# Patient Record
Sex: Female | Born: 1990 | Race: White | Hispanic: No | Smoking: Never smoker
Health system: Southern US, Community
[De-identification: ages and names within clinical notes are randomized; demographics above are authoritative.]

## PROBLEM LIST (undated history)

## (undated) DIAGNOSIS — R55 Syncope and collapse: Secondary | ICD-10-CM

---

## 2020-03-10 ENCOUNTER — Encounter (HOSPITAL_COMMUNITY): Payer: Self-pay

## 2020-03-10 ENCOUNTER — Emergency Department (HOSPITAL_COMMUNITY): Payer: Self-pay

## 2020-03-10 ENCOUNTER — Other Ambulatory Visit: Payer: Self-pay

## 2020-03-10 ENCOUNTER — Emergency Department (HOSPITAL_COMMUNITY)
Admission: EM | Admit: 2020-03-10 | Discharge: 2020-03-10 | Disposition: A | Discharge status: Home / Self Care | Payer: Self-pay | Attending: Emergency Medicine | Admitting: Emergency Medicine

## 2020-03-10 DIAGNOSIS — M25511 Pain in right shoulder: Secondary | ICD-10-CM | POA: Insufficient documentation

## 2020-03-10 DIAGNOSIS — R1011 Right upper quadrant pain: Secondary | ICD-10-CM | POA: Insufficient documentation

## 2020-03-10 DIAGNOSIS — R110 Nausea: Secondary | ICD-10-CM | POA: Insufficient documentation

## 2020-03-10 DIAGNOSIS — R109 Unspecified abdominal pain: Secondary | ICD-10-CM

## 2020-03-10 HISTORY — DX: Syncope and collapse: R55

## 2020-03-10 LAB — COMPREHENSIVE METABOLIC PANEL
ALT: 13 U/L (ref 0–44)
AST: 16 U/L (ref 15–41)
Albumin: 4.2 g/dL (ref 3.5–5.0)
Alkaline Phosphatase: 51 U/L (ref 38–126)
Anion gap: 9 (ref 5–15)
BUN: 8 mg/dL (ref 6–20)
CO2: 26 mmol/L (ref 22–32)
Calcium: 9.1 mg/dL (ref 8.9–10.3)
Chloride: 102 mmol/L (ref 98–111)
Creatinine, Ser: 0.62 mg/dL (ref 0.44–1.00)
GFR calc Af Amer: >60 mL/min (ref >60)
GFR calc non Af Amer: >60 mL/min (ref >60)
Glucose, Bld: 83 mg/dL (ref 70–99)
Potassium: 3.4 mmol/L — ABNORMAL LOW (ref 3.5–5.1)
Sodium: 137 mmol/L (ref 135–145)
Total Bilirubin: 0.7 mg/dL (ref 0.3–1.2)
Total Protein: 7.1 g/dL (ref 6.5–8.1)

## 2020-03-10 LAB — CBC WITH DIFFERENTIAL/PLATELET
Abs Immature Granulocytes: 0.03 10*3/uL (ref 0.00–0.07)
Basophils Absolute: 0 10*3/uL (ref 0.0–0.1)
Basophils Relative: 0 %
Eosinophils Absolute: 0.1 10*3/uL (ref 0.0–0.5)
Eosinophils Relative: 1 %
HCT: 45.5 % (ref 36.0–46.0)
Hemoglobin: 14.9 g/dL (ref 12.0–15.0)
Immature Granulocytes: 0 %
Lymphocytes Relative: 25 %
Lymphs Abs: 2.1 10*3/uL (ref 0.7–4.0)
MCH: 31 pg (ref 26.0–34.0)
MCHC: 32.7 g/dL (ref 30.0–36.0)
MCV: 94.8 fL (ref 80.0–100.0)
Monocytes Absolute: 0.4 10*3/uL (ref 0.1–1.0)
Monocytes Relative: 5 %
Neutro Abs: 5.7 10*3/uL (ref 1.7–7.7)
Neutrophils Relative %: 69 %
Platelets: 270 10*3/uL (ref 150–400)
RBC: 4.8 MIL/uL (ref 3.87–5.11)
RDW: 11.7 % (ref 11.5–15.5)
WBC: 8.3 10*3/uL (ref 4.0–10.5)
nRBC: 0 % (ref 0.0–0.2)

## 2020-03-10 LAB — URINALYSIS, ROUTINE W REFLEX MICROSCOPIC
Bilirubin Urine: NEGATIVE
Glucose, UA: NEGATIVE mg/dL
Hgb urine dipstick: NEGATIVE
Ketones, ur: 80 mg/dL — AB
Leukocytes,Ua: NEGATIVE
Nitrite: NEGATIVE
Protein, ur: NEGATIVE mg/dL
Specific Gravity, Urine: 1.014 (ref 1.005–1.030)
pH: 6 (ref 5.0–8.0)

## 2020-03-10 LAB — LIPASE, BLOOD: Lipase: 19 U/L (ref 11–51)

## 2020-03-10 LAB — I-STAT BETA HCG BLOOD, ED (MC, WL, AP ONLY): I-stat hCG, quantitative: <5 m[IU]/mL (ref <5)

## 2020-03-10 MED ORDER — SODIUM CHLORIDE 0.9 % IV BOLUS
500.0000 mL | Freq: Once | INTRAVENOUS | Status: AC
  Administered 2020-03-10: 500 mL via INTRAVENOUS

## 2020-03-10 MED ORDER — GADOBUTROL 1 MMOL/ML IV SOLN
6.5000 mL | Freq: Once | INTRAVENOUS | Status: AC | PRN
  Administered 2020-03-10: 6.5 mL via INTRAVENOUS

## 2020-03-10 MED ORDER — ALUM & MAG HYDROXIDE-SIMETH 200-200-20 MG/5ML PO SUSP
30.0000 mL | Freq: Once | ORAL | Status: AC
  Administered 2020-03-10: 18:00:00 30 mL via ORAL
  Filled 2020-03-10: qty 30

## 2020-03-10 MED ORDER — SODIUM CHLORIDE 0.9% FLUSH: 3.0000 mL | Freq: Once | INTRAVENOUS | Status: DC

## 2020-03-10 MED ORDER — LIDOCAINE VISCOUS HCL 2 % MT SOLN
15.0000 mL | Freq: Once | OROMUCOSAL | Status: AC
  Administered 2020-03-10: 15 mL via ORAL
  Filled 2020-03-10: qty 15

## 2020-03-10 MED ORDER — MORPHINE SULFATE (PF) 4 MG/ML IV SOLN
4.0000 mg | Freq: Once | INTRAVENOUS | Status: DC
  Filled 2020-03-10: qty 1

## 2020-03-10 MED ORDER — PANTOPRAZOLE SODIUM 20 MG PO TBEC: 20.0000 mg | DELAYED_RELEASE_TABLET | Freq: Every day | ORAL | 0 refills | Status: AC

## 2020-03-10 MED ORDER — MORPHINE SULFATE (PF) 2 MG/ML IV SOLN: 2.0000 mg | Freq: Once | INTRAVENOUS | Status: DC

## 2020-03-10 NOTE — ED Provider Notes (Signed)
[facility]-EMERGENCY DEPT Provider Note   CSN: [id number] Arrival date & time: 03/10/20  1412     History Chief Complaint  Patient presents with  . Abdominal Pain    Jenny Weiss is a 29 y.o. female otherwise healthy no daily medication use presents today for right upper quadrant abdominal pain beginning 2 days ago.  Pain is intermittent waxing and waning aching squeezing sensation that is worse approximately 1 hour after eating gradually resolves without intervention.  Occasionally pain will radiate to right shoulder when it is most severe.  Pain currently minimal.  Associated with nausea.  Denies fever/chills, headache, chest pain/shortness of breath, cough/hemoptysis, pleurisy, vomiting, diarrhea, dysuria/hematuria, vaginal bleeding/discharge or any additional concerns.  HPI     Past Medical History:  Diagnosis Date  . Syncope     There are no problems to display for this patient.   History reviewed. No pertinent surgical history.   OB History   No obstetric history on file.     History reviewed. No pertinent family history.  Social History   Tobacco Use  . Smoking status: Never Smoker  . Smokeless tobacco: Never Used  Substance Use Topics  . Alcohol use: Yes    Comment: one beer a day   . Drug use: Never    Home Medications Prior to Admission medications   Medication Sig Start Date End Date Taking? Authorizing Provider  pantoprazole (PROTONIX) 20 MG tablet Take 1 tablet (20 mg total) by mouth daily. 03/10/20 04/09/20  Bill Salinas, PA-C    Allergies    Patient has no known allergies.  Review of Systems   Review of Systems Ten systems are reviewed and are negative for acute change except as noted in the HPI  Physical Exam Updated Vital Signs BP 117/68   Pulse 71   Temp 98.2 F (36.8 C) (Oral)   Resp 18   Ht 5\' 8"  (1.727 m)   Wt 52.2 kg   LMP 03/03/2020 (Approximate)   SpO2 93%   BMI 17.49 kg/m   Physical  Exam Constitutional:      General: She is not in acute distress.    Appearance: Normal appearance. She is well-developed. She is not ill-appearing or diaphoretic.  HENT:     Head: Normocephalic and atraumatic.     Right Ear: External ear normal.     Left Ear: External ear normal.     Nose: Nose normal.  Eyes:     General: Vision grossly intact. Gaze aligned appropriately.     Pupils: Pupils are equal, round, and reactive to light.  Neck:     Trachea: Trachea and phonation normal. No tracheal deviation.  Cardiovascular:     Rate and Rhythm: Normal rate and regular rhythm.  Pulmonary:     Effort: Pulmonary effort is normal. No respiratory distress.     Breath sounds: Normal breath sounds.  Abdominal:     General: There is no distension.     Palpations: Abdomen is soft.     Tenderness: There is abdominal tenderness in the right upper quadrant. There is no right CVA tenderness, left CVA tenderness, guarding or rebound. Positive signs include Murphy's sign. Negative signs include McBurney's sign.  Genitourinary:    Comments: Deferred by patient Musculoskeletal:        General: Normal range of motion.     Cervical back: Normal range of motion.  Skin:    General: Skin is warm and dry.  Neurological:  Mental Status: She is alert.     GCS: GCS eye subscore is 4. GCS verbal subscore is 5. GCS motor subscore is 6.     Comments: Speech is clear and goal oriented, follows commands Major Cranial nerves without deficit, no facial droop Moves extremities without ataxia, coordination intact  Psychiatric:        Behavior: Behavior normal.     ED Results / Procedures / Treatments   Labs (all labs ordered are listed, but only abnormal results are displayed) Labs Reviewed  COMPREHENSIVE METABOLIC PANEL - Abnormal; Notable for the following components:      Result Value   Potassium 3.4 (*)    All other components within normal limits  URINALYSIS, ROUTINE W REFLEX MICROSCOPIC - Abnormal;  Notable for the following components:   Ketones, ur 80 (*)    All other components within normal limits  LIPASE, BLOOD  CBC WITH DIFFERENTIAL/PLATELET  I-STAT BETA HCG BLOOD, ED ([facility], [facility], [facility] ONLY)    EKG None  Radiology MR 3D Recon At Scanner  Result Date: 03/10/2020 CLINICAL DATA:  Right upper quadrant abdominal pain. Nondiagnostic ultrasound with nonvisualization of the gallbladder. No history of abdominal surgery. EXAM: MRI ABDOMEN WITH CONTRAST (WITH MRCP) TECHNIQUE: Multiplanar multisequence MR imaging of the abdomen was performed following the administration of intravenous contrast. Heavily T2-weighted images of the biliary and pancreatic ducts were obtained, and three-dimensional MRCP images were rendered by post processing. CONTRAST:  6.27mL GADAVIST GADOBUTROL 1 MMOL/ML IV SOLN COMPARISON:  03/10/2020 abdominal sonogram. FINDINGS: Lower chest: No acute abnormality at the lung bases. Hepatobiliary: Normal liver size and configuration. No hepatic steatosis. No liver mass. Gallbladder not visualized. No biliary ductal dilatation. Common bile duct diameter 4 mm. No evidence of choledocholithiasis. No biliary strictures, masses or beading. Pancreas: No pancreatic mass or duct dilation.  No pancreas divisum. Spleen: Normal size. No mass. Adrenals/Urinary Tract: Normal adrenals. No hydronephrosis. Normal kidneys with no renal mass. Stomach/Bowel: Normal non-distended stomach. Visualized small and large bowel is normal caliber, with no bowel wall thickening. Vascular/Lymphatic: Normal caliber abdominal aorta. Patent portal, splenic, hepatic and renal veins. No pathologically enlarged lymph nodes in the abdomen. Other: No abdominal ascites or focal fluid collection. Musculoskeletal: No aggressive appearing focal osseous lesions. IMPRESSION: 1. No acute abnormality. 2. Gallbladder not visualized. Patient has no history of abdominal surgery by report. 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 3. Normal bile ducts. No choledocholithiasis. Electronically Signed   By: Ilona Sorrel M.D.   On: 03/10/2020 21:09   MR ABDOMEN WITH MRCP W CONTRAST  Result Date: 03/10/2020 CLINICAL DATA:  Right upper quadrant abdominal pain. Nondiagnostic ultrasound with nonvisualization of the gallbladder. No history of abdominal surgery. EXAM: MRI ABDOMEN WITH CONTRAST (WITH MRCP) TECHNIQUE: Multiplanar multisequence MR imaging of the abdomen was performed following the administration of intravenous contrast. Heavily T2-weighted images of the biliary and pancreatic ducts were obtained, and three-dimensional MRCP images were rendered by post processing. CONTRAST:  6.3mL GADAVIST GADOBUTROL 1 MMOL/ML IV SOLN COMPARISON:  03/10/2020 abdominal sonogram. FINDINGS: Lower chest: No acute abnormality at the lung bases. Hepatobiliary: Normal liver size and configuration. No hepatic steatosis. No liver mass. Gallbladder not visualized. No biliary ductal dilatation. Common bile duct diameter 4 mm. No evidence of choledocholithiasis. No biliary strictures, masses or beading. Pancreas: No pancreatic mass or duct dilation.  No pancreas divisum. Spleen: Normal size. No mass. Adrenals/Urinary Tract: Normal adrenals. No hydronephrosis. Normal kidneys with no renal mass. Stomach/Bowel: Normal non-distended stomach.  Visualized small and large bowel is normal caliber, with no bowel wall thickening. Vascular/Lymphatic: Normal caliber abdominal aorta. Patent portal, splenic, hepatic and renal veins. No pathologically enlarged lymph nodes in the abdomen. Other: No abdominal ascites or focal fluid collection. Musculoskeletal: No aggressive appearing focal osseous lesions. IMPRESSION: 1. No acute abnormality. 2. Gallbladder not visualized. Patient has no history of abdominal surgery by report. 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 3. Normal bile ducts. No choledocholithiasis. Electronically Signed   By:  Delbert Phenix M.D.   On: 03/10/2020 21:09   US Abdomen Limited RUQ  Result Date: 03/10/2020 CLINICAL DATA:  Right upper quadrant abdominal pain. Patient reports no history of abdominal surgery. EXAM: ULTRASOUND ABDOMEN LIMITED RIGHT UPPER QUADRANT COMPARISON:  None. FINDINGS: Gallbladder: Gallbladder could not be identified. Common bile duct: Diameter: 3 mm Liver: No focal lesion identified. Within normal limits in parenchymal echogenicity. Portal vein is patent on color Doppler imaging with normal direction of blood flow towards the liver. Other: None. IMPRESSION: 1. Nonvisualization of the gallbladder by ultrasound. Differential includes congenital absence of the gallbladder or occult completely contracted gallbladder. Further evaluation with MRI abdomen with MRCP without and with IV contrast or CT abdomen with IV and oral contrast could be considered as clinically warranted. 2. No biliary ductal dilatation.  CBD diameter 3 mm. 3. Normal liver. Electronically Signed   By: Delbert Phenix M.D.   On: 03/10/2020 18:05    Procedures Procedures (including critical care time)  Medications Ordered in ED Medications  sodium chloride flush (NS) 0.9 % injection 3 mL (has no administration in time range)  morphine 2 MG/ML injection 2 mg (2 mg Intravenous Not Given 03/10/20 1856)  alum & mag hydroxide-simeth (MAALOX/MYLANTA) 200-200-20 MG/5ML suspension 30 mL (30 mLs Oral Given 03/10/20 1745)    And  lidocaine (XYLOCAINE) 2 % viscous mouth solution 15 mL (15 mLs Oral Given 03/10/20 1745)  sodium chloride 0.9 % bolus 500 mL (0 mLs Intravenous Stopped 03/10/20 2227)  gadobutrol (GADAVIST) 1 MMOL/ML injection 6.5 mL (6.5 mLs Intravenous Contrast Given 03/10/20 2047)    ED Course  I have reviewed the triage vital signs and the nursing notes.  Pertinent labs & imaging results that were available during my care of the patient were reviewed by me and considered in my medical decision making (see chart for  details).    MDM Rules/Calculators/A&P                     29 year old otherwise healthy female presents today for right upper quadrant pain x2 days worse after eating.  On exam she is well-appearing no acute distress, vital signs stable.  She has mild right upper quadrant tenderness and a positive Murphy sign.  No guarding rebound or lower abdominal tenderness.  Blood work ordered in triage does not have been drawn, clinically concern for possible symptomatic cholelithiasis versus cholecystitis, right upper quadrant ultrasound has been ordered.  She has no chest pain or shortness of breath, low risk by Wells criteria and PERC negative, low suspicion for pulmonary embolism, pneumonia, ACS or other intrathoracic etiology of patient's pain today. - RUQ ultrasound:  IMPRESSION:  1. Nonvisualization of the gallbladder by ultrasound. Differential  includes congenital absence of the gallbladder or occult completely  contracted gallbladder. Further evaluation with MRI abdomen with  MRCP without and with IV contrast or CT abdomen with IV and oral  contrast could be considered as clinically warranted.  2. No biliary ductal dilatation.  CBD diameter 3 mm.  3. Normal liver.   Patient reevaluated GI cocktail did not improve symptoms.  She states understanding of ultrasound findings as above, I discussed CT versus MRI imaging today, patient does not want a CT scan due to radiation.  She is requesting MRI of the abdomen for further evaluation feel this is reasonable at this time.  MRI with MRCP ordered. - Blood work resulted, CBC within normal limits no leukocytosis to suggest infection, no evidence of anemia.  Lipase within normal limits evidence of pancreatitis.  Pregnancy test negative.  CMP shows potassium 3.4 otherwise within normal limits, no emergent electrolyte derangement, evidence of kidney injury or elevation of LFTs.  Urinalysis pending. EKG shows sinus rhythm at rate of 75; no acute changes  reviewed with Dr. Effie Shy. - Patient reevaluated resting comfortably no acute distress awaiting MRI.  Vital signs stable - MRI/MRCP: IMPRESSION:  1. No acute abnormality.  2. Gallbladder not visualized. Patient has no history of abdominal  surgery by report. 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  3. Normal bile ducts. No choledocholithiasis   Urinalysis shows ketones, suspect secondary to dehydration.  She was given fluid bolus today, should improve with return increasing water intake at home; she has no evidence of DKA today and is not a diabetic. - Patient reevaluated resting comfortably no acute distress, she states understanding of findings as above and has no questions.  We will treat patient today with PPI and have her follow-up with her primary care provider.  On reassessment the abdomen is soft nontender and without peritoneal signs.  Her pain is in the right upper quadrant, possible that her gallbladder atresia some colicky pain today, there is no evidence of obstruction or other emergent intra-abdominal pathology requiring further work-up in the ER at this time.  Additionally she has no lower abdominal pain to suggest PID, torsion, TOA or other intrapelvic etiology at this time, patient deferred pelvic examination which is reasonable at this time.  She is agreeable to discharge and outpatient PCP follow-up.  At this time there does not appear to be any evidence of an acute emergency medical condition and the patient appears stable for discharge with appropriate outpatient follow up. Diagnosis was discussed with patient who verbalizes understanding of care plan and is agreeable to discharge. I have discussed return precautions with patient who verbalizes understanding of return precautions. Patient encouraged to follow-up with their PCP. All questions answered.  Patient's case discussed with Dr. Effie Shy who agrees with plan to discharge with follow-up.   Note:  Portions of this report may have been transcribed using voice recognition software. Every effort was made to ensure accuracy; however, inadvertent computerized transcription errors may still be present. Final Clinical Impression(s) / ED Diagnoses Final diagnoses:  RUQ abdominal pain    Rx / DC Orders ED Discharge Orders         Ordered    pantoprazole (PROTONIX) 20 MG tablet  Daily     03/10/20 2320           Elizabeth Palau 03/10/20 2324    Mancel Bale, MD 03/11/20 1038

## 2020-03-10 NOTE — ED Notes (Signed)
Patient transported to MRI 

## 2020-03-10 NOTE — ED Notes (Signed)
US bedside

## 2020-03-10 NOTE — ED Triage Notes (Signed)
Pt presents with c/o right upper quadrant abdominal pain. Pt reports the pain has been present for 2 days. Pt reports that when the pain started, she also was feeling significant right shoulder pain. Pt denies V/D but does reports some nausea. Pt reports the pain is worse a few hours after eating.

## 2020-03-10 NOTE — Discharge Instructions (Addendum)
You have been diagnosed today with right upper quadrant pain.  At this time there does not appear to be the presence of an emergent medical condition, however there is always the potential for conditions to change. Please read and follow the below instructions.  Please return to the Emergency Department immediately for any new or worsening symptoms. Please be sure to follow up with your Primary Care Provider within one week regarding your visit today; please call their office to schedule an appointment even if you are feeling better for a follow-up visit. Please take the medication Protonix as prescribed to help with your symptoms.  Please drink plenty of water and get plenty of rest. As we discussed it appears that you do not have a gallbladder and may have been born this way.  Please discuss this with your primary care provider at your follow-up visit this week.  Get help right away if: Your pain does not go away as soon as your doctor says it should. You cannot stop vomiting. Your pain is only in areas of your belly, such as the right side or the left lower part of the belly. You have bloody or black poop, or poop that looks like tar. You have very bad pain, cramping, or bloating in your belly. You have signs of not having enough fluid or water in your body (dehydration), such as: Dark pee, very little pee, or no pee. Cracked lips. Dry mouth. Sunken eyes. Sleepiness. Weakness. You have trouble breathing or chest pain. You have any new/concerning or worsening of symptoms  Please read the additional information packets attached to your discharge summary.  Do not take your medicine if  develop an itchy rash, swelling in your mouth or lips, or difficulty breathing; call 911 and seek immediate emergency medical attention if this occurs.  Note: Portions of this text may have been transcribed using voice recognition software. Every effort was made to ensure accuracy; however, inadvertent  computerized transcription errors may still be present.

## 2021-02-14 IMAGING — MR MR ABDOMEN W/ CM MRCP
15 of 16 series · 45 of 48 positions shown · IV contrast (gadavist)
Comparison: 03/10/2020 abdominal sonogram.

CLINICAL DATA: Right upper quadrant abdominal pain. Nondiagnostic
ultrasound with nonvisualization of the gallbladder. No history of
abdominal surgery.

EXAM:
MRI ABDOMEN WITH CONTRAST (WITH MRCP)
TECHNIQUE: Multiplanar multisequence MR imaging of the abdomen was performed
following the administration of intravenous contrast. Heavily
T2-weighted images of the biliary and pancreatic ducts were
obtained, and three-dimensional MRCP images were rendered by post
processing.
CONTRAST:  6.5mL GADAVIST GADOBUTROL 1 MMOL/ML IV SOLN

[Series 3: T2 fat-sat · axial · 6.0mm · 1.25mm/px · z∈[-181,+71]mm · 2 of 36 slices shown]
[im 1/36]
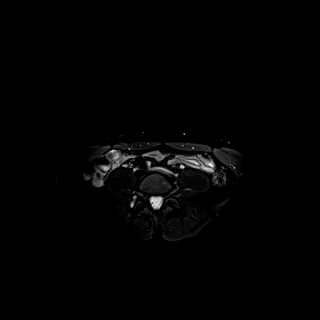
[im 36/36]
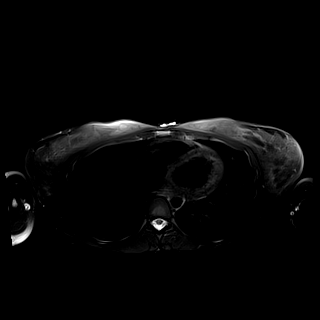

[Series 5: bSSFP · coronal · 6.0mm · 0.74mm/px · 2 of 35 slices shown]
[im 1/35]
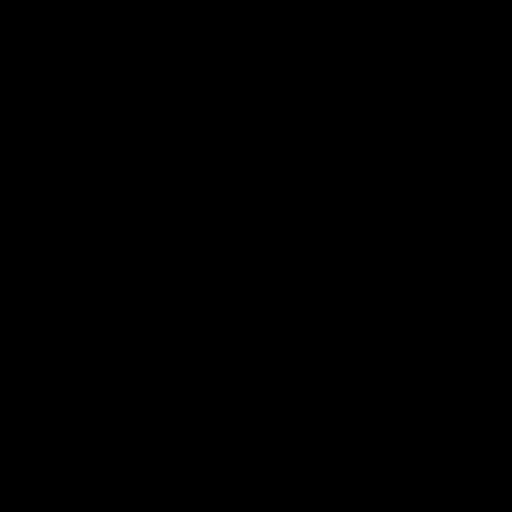
[im 35/35]
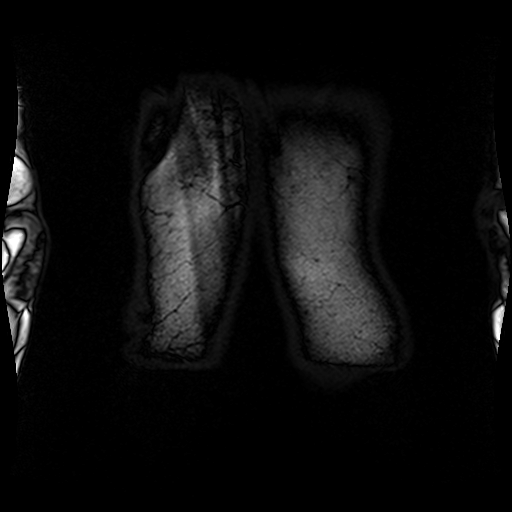

[Series 6: DWI · axial · 6.0mm · 1.49mm/px · z∈[-158,+94]mm · 4 of 72 slices shown (1 of 2)]
[im 1/72]
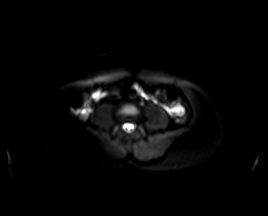
[im 24/72]
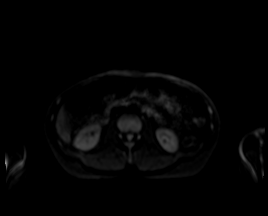
[im 48/72]
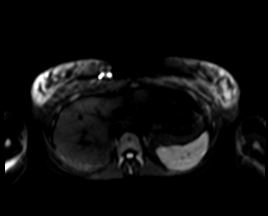
[im 72/72]
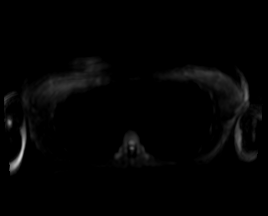

[Series 7: DWI · axial · 6.0mm · 1.49mm/px · z∈[-158,+94]mm · 2 of 36 slices shown (2 of 2)]
[im 1/36]
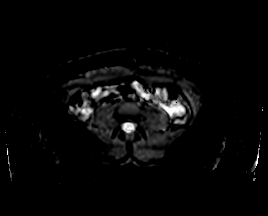
[im 36/36]
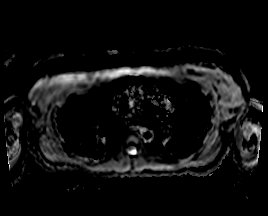

[Series 9: T1 · axial · 3.0mm · 1.25mm/px · z∈[-133,+80]mm · 4 of 72 slices shown (1 of 2)]
[im 1/72]
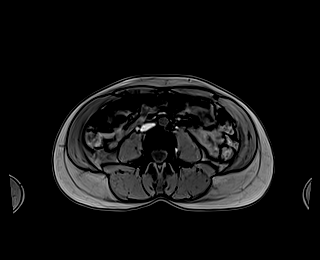
[im 24/72]
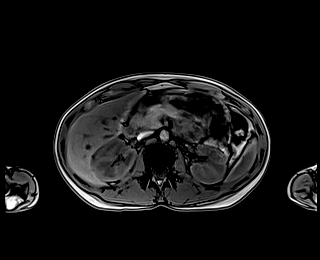
[im 48/72]
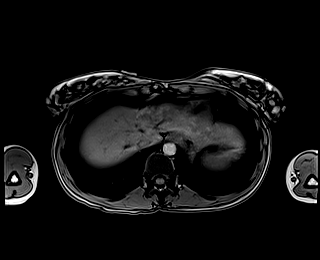
[im 72/72]
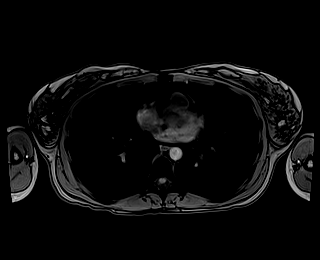

[Series 10: T1 · axial · 3.0mm · 1.25mm/px · z∈[-133,+80]mm · 4 of 72 slices shown (2 of 2)]
[im 1/72]
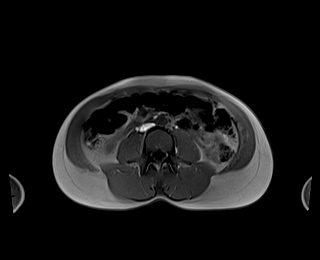
[im 24/72]
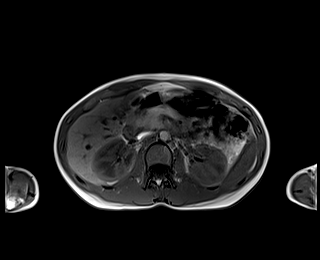
[im 48/72]
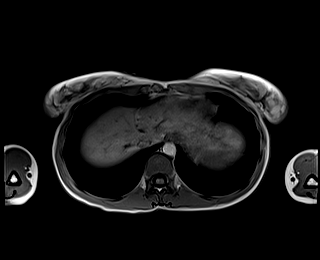
[im 72/72]
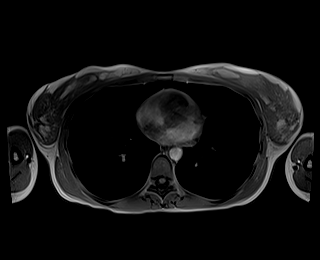

[Series 11: cor obl thk · sagittal · 50.0mm · 0.78mm/px · 1 of 9 slices shown]
[im 1/9]
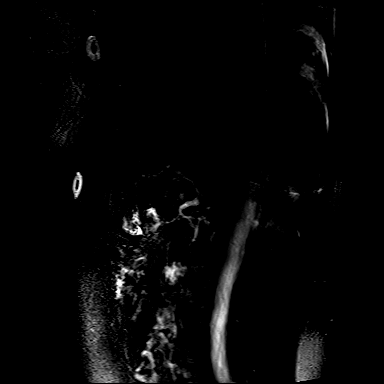

[Series 12: cor_3d_spc_trig-resp · 1 of 11 slices shown]
[im 1/11]
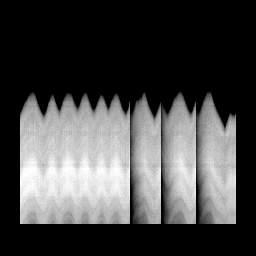

[Series 15: T2 · axial · 6.0mm · 1.56mm/px · z∈[-130,+78]mm · 2 of 30 slices shown]
[im 1/30]
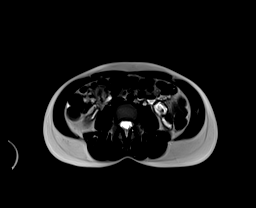
[im 30/30]
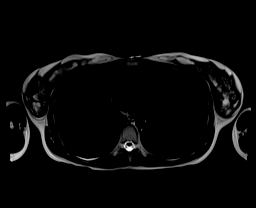

[Series 17: T1 dynamic · axial · 3.0mm · 1.19mm/px · z∈[-145,+92]mm · 4 of 80 slices shown (1 of 6)]
[im 1/80]
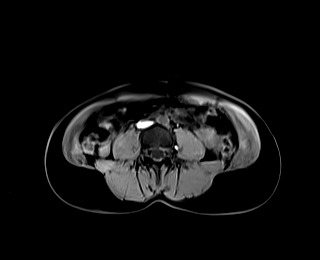
[im 27/80]
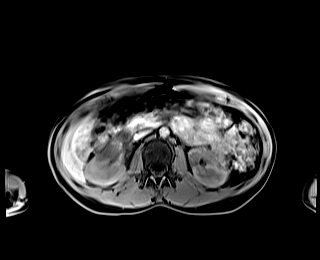
[im 53/80]
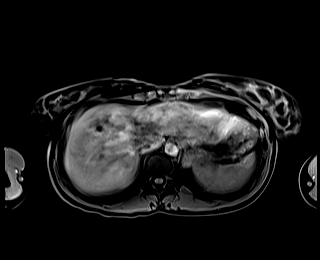
[im 80/80]
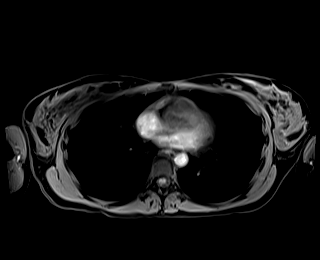

[Series 20: T1 dynamic · axial · 3.0mm · 1.25mm/px · z∈[-120,+117]mm · 4 of 80 slices shown (2 of 6)]
[im 1/80]
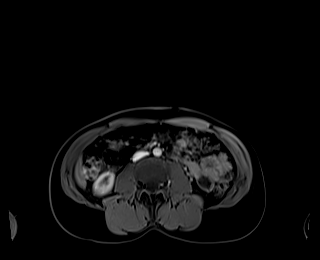
[im 27/80]
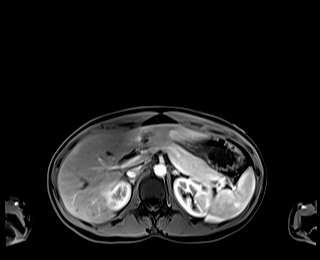
[im 53/80]
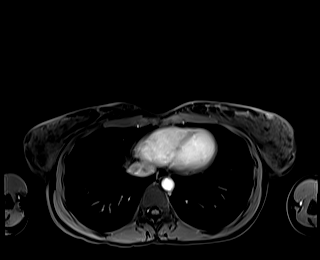
[im 80/80]
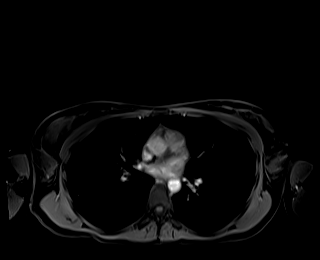

[Series 22: T1 dynamic · axial · 3.0mm · 1.25mm/px · z∈[-120,+117]mm · 4 of 80 slices shown (3 of 6)]
[im 1/80]
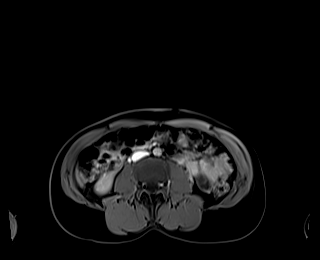
[im 27/80]
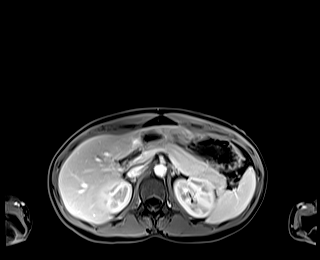
[im 53/80]
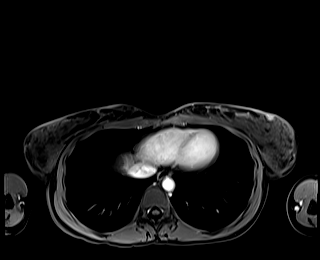
[im 80/80]
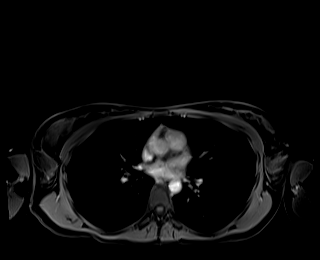

[Series 24: T1 dynamic · axial · 3.0mm · 1.25mm/px · z∈[-120,+117]mm · 4 of 80 slices shown (4 of 6)]
[im 1/80]
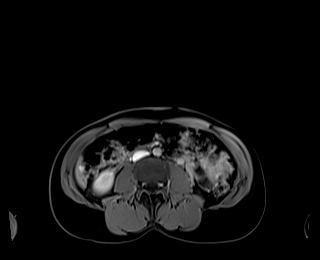
[im 27/80]
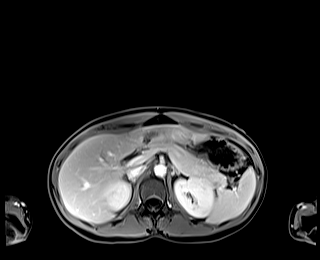
[im 53/80]
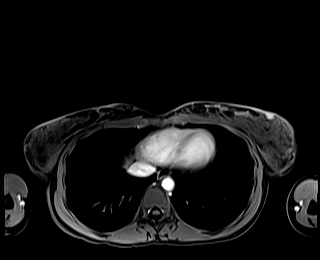
[im 80/80]
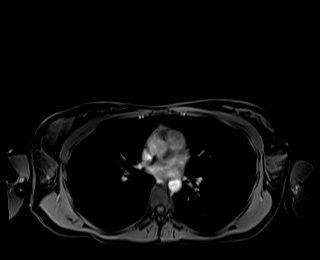

[Series 26: T1 dynamic · coronal · 3.5mm · 1.41mm/px · 3 of 52 slices shown (5 of 6)]
[im 1/52]
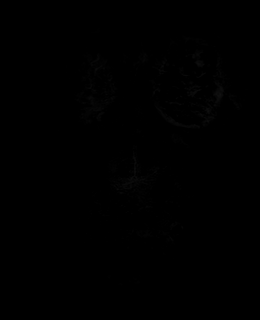
[im 26/52]
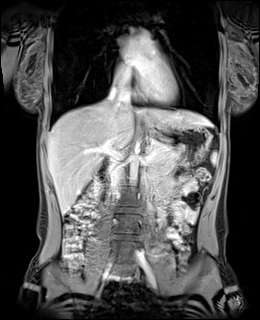
[im 52/52]
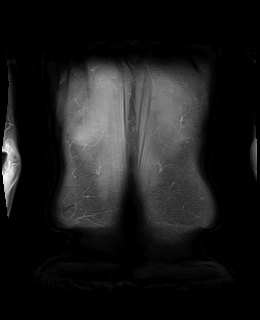

[Series 28: T1 dynamic · axial · 3.0mm · 1.25mm/px · z∈[-120,+117]mm · 4 of 80 slices shown (6 of 6)]
[im 1/80]
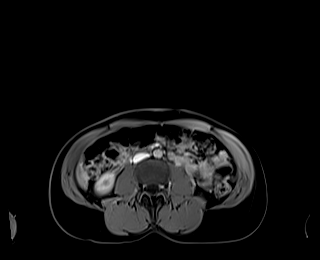
[im 27/80]
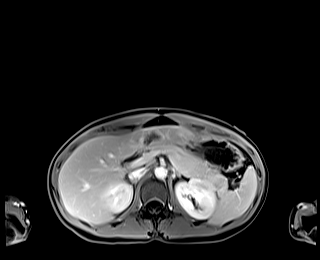
[im 53/80]
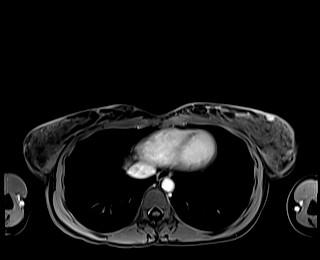
[im 80/80]
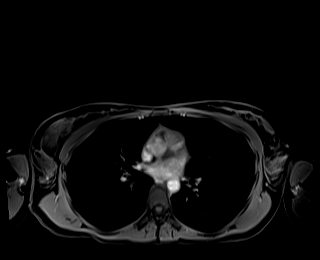

[45 of 48 positions shown; findings below may reference images not displayed]

FINDINGS: Lower chest: No acute abnormality at the lung bases.

Hepatobiliary: Normal liver size and configuration. No hepatic
steatosis. No liver mass. Gallbladder not visualized. No biliary
ductal dilatation. Common bile duct diameter 4 mm. No evidence of
choledocholithiasis. No biliary strictures, masses or beading.

Pancreas: No pancreatic mass or duct dilation.  No pancreas divisum.

Spleen: Normal size. No mass.

Adrenals/Urinary Tract: Normal adrenals. No hydronephrosis. Normal
kidneys with no renal mass.

Stomach/Bowel: Normal non-distended stomach. Visualized small and
large bowel is normal caliber, with no bowel wall thickening.

Vascular/Lymphatic: Normal caliber abdominal aorta. Patent portal,
splenic, hepatic and renal veins. No pathologically enlarged lymph
nodes in the abdomen.

Other: No abdominal ascites or focal fluid collection.

Musculoskeletal: No aggressive appearing focal osseous lesions.
IMPRESSION: 1. No acute abnormality.
2. Gallbladder not visualized. Patient has no history of abdominal
surgery by report. Findings are compatible with congenital absence
of the gallbladder.
3. Normal bile ducts. No choledocholithiasis.

## 2021-10-29 ENCOUNTER — Other Ambulatory Visit: Payer: Self-pay

## 2021-10-29 ENCOUNTER — Emergency Department (HOSPITAL_BASED_OUTPATIENT_CLINIC_OR_DEPARTMENT_OTHER)
Admission: EM | Admit: 2021-10-29 | Discharge: 2021-10-29 | Disposition: A | Discharge status: Home / Self Care | Payer: 59 | Attending: Emergency Medicine | Admitting: Emergency Medicine

## 2021-10-29 ENCOUNTER — Encounter (HOSPITAL_BASED_OUTPATIENT_CLINIC_OR_DEPARTMENT_OTHER): Payer: Self-pay | Admitting: Emergency Medicine

## 2021-10-29 ENCOUNTER — Other Ambulatory Visit (HOSPITAL_BASED_OUTPATIENT_CLINIC_OR_DEPARTMENT_OTHER): Payer: Self-pay

## 2021-10-29 DIAGNOSIS — Z203 Contact with and (suspected) exposure to rabies: Secondary | ICD-10-CM | POA: Diagnosis not present

## 2021-10-29 DIAGNOSIS — S61233A Puncture wound without foreign body of left middle finger without damage to nail, initial encounter: Secondary | ICD-10-CM | POA: Diagnosis not present

## 2021-10-29 DIAGNOSIS — Z2914 Encounter for prophylactic rabies immune globin: Secondary | ICD-10-CM | POA: Diagnosis not present

## 2021-10-29 DIAGNOSIS — S61259A Open bite of unspecified finger without damage to nail, initial encounter: Secondary | ICD-10-CM

## 2021-10-29 DIAGNOSIS — S61231A Puncture wound without foreign body of left index finger without damage to nail, initial encounter: Secondary | ICD-10-CM | POA: Insufficient documentation

## 2021-10-29 DIAGNOSIS — Z23 Encounter for immunization: Secondary | ICD-10-CM | POA: Insufficient documentation

## 2021-10-29 DIAGNOSIS — W5501XA Bitten by cat, initial encounter: Secondary | ICD-10-CM | POA: Insufficient documentation

## 2021-10-29 DIAGNOSIS — S6992XA Unspecified injury of left wrist, hand and finger(s), initial encounter: Secondary | ICD-10-CM | POA: Diagnosis present

## 2021-10-29 MED ORDER — AMOXICILLIN-POT CLAVULANATE 875-125 MG PO TABS
1.0000 | ORAL_TABLET | Freq: Two times a day (BID) | ORAL | 0 refills | Status: AC
  Filled 2021-10-29: qty 13, 7d supply, fill #0

## 2021-10-29 MED ORDER — AMOXICILLIN-POT CLAVULANATE 875-125 MG PO TABS
1.0000 | ORAL_TABLET | Freq: Once | ORAL | Status: AC
  Administered 2021-10-29: 1 via ORAL
  Filled 2021-10-29: qty 1

## 2021-10-29 MED ORDER — RABIES IMMUNE GLOBULIN 150 UNIT/ML IM INJ
20.0000 [IU]/kg | INJECTION | Freq: Once | INTRAMUSCULAR | Status: AC
  Administered 2021-10-29: 1050 [IU] via INTRAMUSCULAR
  Filled 2021-10-29: qty 2

## 2021-10-29 MED ORDER — RABIES VACCINE, PCEC IM SUSR
1.0000 mL | Freq: Once | INTRAMUSCULAR | Status: AC
  Administered 2021-10-29: 1 mL via INTRAMUSCULAR
  Filled 2021-10-29: qty 1

## 2021-10-29 NOTE — Discharge Instructions (Signed)
                                  RABIES VACCINE FOLLOW UP  Patient's Name: Jenny Weiss                     Original Order Date:10/29/2021  Medical Record Number: [id number]  ED Physician: Pricilla Loveless, MD Primary Diagnosis: Rabies Exposure       PCP: Pcp, No  Patient Phone Number: (home) [phone] (home)    (cell)  Telephone Information:  Mobile [phone]    (work) There is no work phone number on file. Species of Animal: Cat   You have been seen in the Emergency Department for a possible rabies exposure. It's very important you return for the additional vaccine doses.  Please call the clinic listed below for hours of operation.   Clinic that will administer your rabies vaccines: [facility] - [phone], [place], [place] [address]  DAY 0:  10/29/2021      DAY 3:  11/01/2021       DAY 7:  11/05/2021     DAY 14:  11/12/2021         The 5th vaccine injection is considered for immune compromised patients only.  DAY 28:  11/26/2021

## 2021-10-29 NOTE — ED Notes (Signed)
Wounds on 4th and 5th finger irrigated followed by bacitracin and band aids

## 2021-10-29 NOTE — ED Provider Notes (Signed)
[facility] EMERGENCY DEPT Provider Note   CSN: [id number] Arrival date & time: 10/29/21  0831     History Chief Complaint  Patient presents with   Animal Bite    Jenny Weiss is a 30 y.o. female.  HPI 30 year old female presents with left finger cat bites. This occurred last night when she picked up a feral cat.  She was wearing gloves but the cat bit through them on her left index and middle fingers.  They do not hurt but she just wants to be checked out and treated.  She denies any swelling.  Her last tetanus immunization was right around 5 years ago.  Past Medical History:  Diagnosis Date   Syncope     There are no problems to display for this patient.   History reviewed. No pertinent surgical history.   OB History   No obstetric history on file.     History reviewed. No pertinent family history.  Social History   Tobacco Use   Smoking status: Never   Smokeless tobacco: Never  Substance Use Topics   Alcohol use: Yes    Comment: one beer a day    Drug use: Never    Home Medications Prior to Admission medications   Medication Sig Start Date End Date Taking? Authorizing Provider  amoxicillin-clavulanate (AUGMENTIN) 875-125 MG tablet Take 1 tablet by mouth 2 (two) times daily. One po bid x 7 days 10/29/21  Yes Pricilla Loveless, MD  pantoprazole (PROTONIX) 20 MG tablet Take 1 tablet (20 mg total) by mouth daily. 03/10/20 04/09/20  Bill Salinas, PA-C    Allergies    Patient has no known allergies.  Review of Systems   Review of Systems  Musculoskeletal:  Negative for arthralgias and joint swelling.  Skin:  Negative for color change and wound.   Physical Exam Updated Vital Signs BP 122/73 (BP Location: Right Arm)   Pulse 64   Temp 98.3 F (36.8 C) (Oral)   Resp 15   Ht 5\' 8"  (1.727 m)   Wt 52.2 kg   SpO2 99%   BMI 17.49 kg/m   Physical Exam Vitals and nursing note reviewed.  Constitutional:      Appearance: She is  well-developed.  HENT:     Head: Normocephalic and atraumatic.     Right Ear: External ear normal.     Left Ear: External ear normal.     Nose: Nose normal.  Eyes:     General:        Right eye: No discharge.        Left eye: No discharge.  Pulmonary:     Effort: Pulmonary effort is normal.     Breath sounds: Normal breath sounds.  Abdominal:     General: There is no distension.  Musculoskeletal:     Comments: Left palmar digits of the index finger and middle finger have small puncture wounds.  No obvious signs of an infection.  No tenderness or palpable foreign bodies.  These are at the distal tips.  Skin:    General: Skin is warm and dry.     Findings: No erythema.  Neurological:     Mental Status: She is alert.  Psychiatric:        Mood and Affect: Mood is not anxious.    ED Results / Procedures / Treatments   Labs (all labs ordered are listed, but only abnormal results are displayed) Labs Reviewed - No data to display  EKG  None  Radiology No results found.  Procedures Procedures   Medications Ordered in ED Medications  amoxicillin-clavulanate (AUGMENTIN) 875-125 MG per tablet 1 tablet (1 tablet Oral Given 10/29/21 0911)  rabies vaccine (RABAVERT) injection 1 mL (1 mL Intramuscular Given 10/29/21 0912)  rabies immune globulin (HYPERAB/KEDRAB) injection 1,050 Units (1,050 Units Intramuscular Given 10/29/21 0914)    ED Course  I have reviewed the triage vital signs and the nursing notes.  Pertinent labs & imaging results that were available during my care of the patient were reviewed by me and considered in my medical decision making (see chart for details).    MDM Rules/Calculators/A&P                           Patient is planning to call animal control after I discussed with her.  However she is also concerned about rabies given the unknown nature of the cat and really wants to start rabies vaccination series now.  I discussed we could wait but she still  wants to start it so she was given immunoglobulin and the first rabies vaccine.  We will also discharge on Augmentin.  Overall has very small wounds and no palpable foreign bodies.  Will need to return if develops signs of infection. Final Clinical Impression(s) / ED Diagnoses Final diagnoses:  Cat bite of finger, initial encounter    Rx / DC Orders ED Discharge Orders          Ordered    amoxicillin-clavulanate (AUGMENTIN) 875-125 MG tablet  2 times daily        10/29/21 5329             Pricilla Loveless, MD 10/29/21 [id number]

## 2021-10-29 NOTE — ED Triage Notes (Signed)
Pt arrives to ED with c/o of cat bite to left index finger and middle finger.

## 2021-11-01 ENCOUNTER — Ambulatory Visit
Admission: EM | Admit: 2021-11-01 | Discharge: 2021-11-01 | Disposition: A | Discharge status: Home / Self Care | Payer: 59 | Attending: Internal Medicine | Admitting: Internal Medicine

## 2021-11-01 DIAGNOSIS — Z203 Contact with and (suspected) exposure to rabies: Secondary | ICD-10-CM

## 2021-11-01 MED ORDER — RABIES VACCINE, PCEC IM SUSR
1.0000 mL | Freq: Once | INTRAMUSCULAR | Status: AC
  Administered 2021-11-01: 1 mL via INTRAMUSCULAR

## 2021-11-01 NOTE — ED Triage Notes (Addendum)
Patient presents for second shot in series for Rabies after cat bite, all noted in chart from visit with ER on 10/29/2021, today is day 3

## 2021-11-05 ENCOUNTER — Other Ambulatory Visit: Payer: Self-pay

## 2021-11-05 ENCOUNTER — Ambulatory Visit
Admission: EM | Admit: 2021-11-05 | Discharge: 2021-11-05 | Disposition: A | Discharge status: Home / Self Care | Payer: 59 | Attending: Emergency Medicine | Admitting: Emergency Medicine

## 2021-11-05 DIAGNOSIS — Z203 Contact with and (suspected) exposure to rabies: Secondary | ICD-10-CM | POA: Diagnosis not present

## 2021-11-05 MED ORDER — RABIES VACCINE, PCEC IM SUSR
1.0000 mL | Freq: Once | INTRAMUSCULAR | Status: AC
  Administered 2021-11-05: 1 mL via INTRAMUSCULAR

## 2021-11-05 NOTE — ED Triage Notes (Signed)
Patient presents for third shot in series for rabies

## 2021-11-12 ENCOUNTER — Ambulatory Visit
Admission: EM | Admit: 2021-11-12 | Discharge: 2021-11-12 | Disposition: A | Discharge status: Home / Self Care | Payer: 59 | Attending: Emergency Medicine | Admitting: Emergency Medicine

## 2021-11-12 ENCOUNTER — Other Ambulatory Visit: Payer: Self-pay

## 2021-11-12 DIAGNOSIS — Z23 Encounter for immunization: Secondary | ICD-10-CM

## 2021-11-12 DIAGNOSIS — Z203 Contact with and (suspected) exposure to rabies: Secondary | ICD-10-CM

## 2021-11-12 MED ORDER — RABIES VACCINE, PCEC IM SUSR
1.0000 mL | Freq: Once | INTRAMUSCULAR | Status: AC
  Administered 2021-11-12: 1 mL via INTRAMUSCULAR

## 2021-11-12 NOTE — ED Triage Notes (Signed)
Pt presents for last rabies vaccine. Denies any concerns at this time.

## 2022-09-07 ENCOUNTER — Emergency Department (HOSPITAL_BASED_OUTPATIENT_CLINIC_OR_DEPARTMENT_OTHER)
Admission: EM | Admit: 2022-09-07 | Discharge: 2022-09-07 | Discharge status: Against Medical Advice | Payer: Medicaid Other | Attending: Emergency Medicine | Admitting: Emergency Medicine

## 2022-09-07 ENCOUNTER — Other Ambulatory Visit: Payer: Self-pay

## 2022-09-07 ENCOUNTER — Encounter (HOSPITAL_BASED_OUTPATIENT_CLINIC_OR_DEPARTMENT_OTHER): Payer: Self-pay

## 2022-09-07 DIAGNOSIS — Z5321 Procedure and treatment not carried out due to patient leaving prior to being seen by health care provider: Secondary | ICD-10-CM | POA: Insufficient documentation

## 2022-09-07 DIAGNOSIS — H538 Other visual disturbances: Secondary | ICD-10-CM | POA: Insufficient documentation

## 2022-09-07 NOTE — ED Triage Notes (Addendum)
Pt describes vision as a kaleidoscope in her right eye. Pt states it started in one spot, spread throughout eye, and then faded out.  Pt states it is occurring again, this time in bilateral eyes.  Pt took Ritalin for the first time today as well- unsure if it is a side effect of that?

## 2023-03-17 ENCOUNTER — Emergency Department (HOSPITAL_COMMUNITY)
Admission: EM | Admit: 2023-03-17 | Discharge: 2023-03-17 | Disposition: A | Discharge status: Home / Self Care | Payer: Medicaid Other | Attending: Emergency Medicine | Admitting: Emergency Medicine

## 2023-03-17 ENCOUNTER — Other Ambulatory Visit: Payer: Self-pay

## 2023-03-17 ENCOUNTER — Encounter (HOSPITAL_COMMUNITY): Payer: Self-pay

## 2023-03-17 DIAGNOSIS — R110 Nausea: Secondary | ICD-10-CM

## 2023-03-17 DIAGNOSIS — E860 Dehydration: Secondary | ICD-10-CM | POA: Diagnosis not present

## 2023-03-17 DIAGNOSIS — R112 Nausea with vomiting, unspecified: Secondary | ICD-10-CM | POA: Insufficient documentation

## 2023-03-17 DIAGNOSIS — R197 Diarrhea, unspecified: Secondary | ICD-10-CM | POA: Diagnosis not present

## 2023-03-17 LAB — COMPREHENSIVE METABOLIC PANEL
ALT: 20 U/L (ref 0–44)
AST: 23 U/L (ref 15–41)
Albumin: 4.3 g/dL (ref 3.5–5.0)
Alkaline Phosphatase: 42 U/L (ref 38–126)
Anion gap: 8 (ref 5–15)
BUN: 8 mg/dL (ref 6–20)
CO2: 25 mmol/L (ref 22–32)
Calcium: 8.9 mg/dL (ref 8.9–10.3)
Chloride: 104 mmol/L (ref 98–111)
Creatinine, Ser: 0.7 mg/dL (ref 0.44–1.00)
GFR, Estimated: >60 mL/min (ref >60)
Glucose, Bld: 98 mg/dL (ref 70–99)
Potassium: 3.6 mmol/L (ref 3.5–5.1)
Sodium: 137 mmol/L (ref 135–145)
Total Bilirubin: 0.6 mg/dL (ref 0.3–1.2)
Total Protein: 7 g/dL (ref 6.5–8.1)

## 2023-03-17 LAB — I-STAT BETA HCG BLOOD, ED (MC, WL, AP ONLY): I-stat hCG, quantitative: <5 m[IU]/mL (ref <5)

## 2023-03-17 LAB — CBC
HCT: 42.2 % (ref 36.0–46.0)
Hemoglobin: 14.4 g/dL (ref 12.0–15.0)
MCH: 31.6 pg (ref 26.0–34.0)
MCHC: 34.1 g/dL (ref 30.0–36.0)
MCV: 92.7 fL (ref 80.0–100.0)
Platelets: 264 10*3/uL (ref 150–400)
RBC: 4.55 MIL/uL (ref 3.87–5.11)
RDW: 11.3 % — ABNORMAL LOW (ref 11.5–15.5)
WBC: 4.9 10*3/uL (ref 4.0–10.5)
nRBC: 0 % (ref 0.0–0.2)

## 2023-03-17 LAB — URINALYSIS, ROUTINE W REFLEX MICROSCOPIC
Bilirubin Urine: NEGATIVE
Glucose, UA: NEGATIVE mg/dL
Hgb urine dipstick: NEGATIVE
Ketones, ur: NEGATIVE mg/dL
Leukocytes,Ua: NEGATIVE
Nitrite: NEGATIVE
Protein, ur: NEGATIVE mg/dL
Specific Gravity, Urine: 1.004 — ABNORMAL LOW (ref 1.005–1.030)
pH: 7 (ref 5.0–8.0)

## 2023-03-17 LAB — LIPASE, BLOOD: Lipase: 27 U/L (ref 11–51)

## 2023-03-17 MED ORDER — ONDANSETRON HCL 4 MG PO TABS: 4.0000 mg | ORAL_TABLET | Freq: Four times a day (QID) | ORAL | 0 refills | Status: DC

## 2023-03-17 MED ORDER — ONDANSETRON HCL 4 MG PO TABS: 4.0000 mg | ORAL_TABLET | Freq: Four times a day (QID) | ORAL | 0 refills | Status: AC

## 2023-03-17 MED ORDER — SODIUM CHLORIDE 0.9 % IV BOLUS
1000.0000 mL | Freq: Once | INTRAVENOUS | Status: AC
  Administered 2023-03-17: 1000 mL via INTRAVENOUS

## 2023-03-17 NOTE — Discharge Instructions (Signed)
Return for any problem.  ?

## 2023-03-17 NOTE — ED Provider Notes (Signed)
[place] EMERGENCY DEPARTMENT AT [facility] Provider Note   CSN: [id number] Arrival date & time: 03/17/23  1729     History  Chief Complaint  Patient presents with   Abdominal Pain   Diarrhea    Jenny Weiss is a 32 y.o. female.  32 year old female with prior medical history as detailed below presents for evaluation.  Patient complains of nausea, vomiting, diarrhea that began 4 days prior.  She reports that her symptoms have improved over the last 24 hours.  Patient complains of continued gassy crampy pain to her diffuse abdomen.  She denies fever.  She denies bloody emesis or bloody stool.  She denies known sick contacts with similar symptoms.    The history is provided by the patient and medical records.       Home Medications Prior to Admission medications   Medication Sig Start Date End Date Taking? Authorizing Provider  ondansetron (ZOFRAN) 4 MG tablet Take 1 tablet (4 mg total) by mouth every 6 (six) hours. 03/17/23  Yes Valarie Merino, MD  amoxicillin-clavulanate (AUGMENTIN) 875-125 MG tablet Take 1 tablet by mouth 2 (two) times daily. 10/29/21   Sherwood Gambler, MD  pantoprazole (PROTONIX) 20 MG tablet Take 1 tablet (20 mg total) by mouth daily. 03/10/20 04/09/20  Deliah Boston, PA-C      Allergies    Patient has no known allergies.    Review of Systems   Review of Systems  All other systems reviewed and are negative.   Physical Exam Updated Vital Signs BP (!) 116/92   Pulse 61   Temp 98.3 F (36.8 C)   Resp 18   Ht 5\' 6"  (1.676 m)   Wt 47.6 kg   SpO2 99%   BMI 16.95 kg/m  Physical Exam Vitals and nursing note reviewed.  Constitutional:      General: She is not in acute distress.    Appearance: Normal appearance. She is well-developed.  HENT:     Head: Normocephalic and atraumatic.  Eyes:     Conjunctiva/sclera: Conjunctivae normal.     Pupils: Pupils are equal, round, and reactive to light.  Cardiovascular:      Rate and Rhythm: Normal rate and regular rhythm.     Heart sounds: Normal heart sounds.  Pulmonary:     Effort: Pulmonary effort is normal. No respiratory distress.     Breath sounds: Normal breath sounds.  Abdominal:     General: There is no distension.     Palpations: Abdomen is soft.     Tenderness: There is no abdominal tenderness.  Musculoskeletal:        General: No deformity. Normal range of motion.     Cervical back: Normal range of motion and neck supple.  Skin:    General: Skin is warm and dry.  Neurological:     General: No focal deficit present.     Mental Status: She is alert and oriented to person, place, and time.     ED Results / Procedures / Treatments   Labs (all labs ordered are listed, but only abnormal results are displayed) Labs Reviewed  CBC - Abnormal; Notable for the following components:      Result Value   RDW 11.3 (*)    All other components within normal limits  URINALYSIS, ROUTINE W REFLEX MICROSCOPIC - Abnormal; Notable for the following components:   Color, Urine STRAW (*)    Specific Gravity, Urine 1.004 (*)    All other components  within normal limits  LIPASE, BLOOD  COMPREHENSIVE METABOLIC PANEL  I-STAT BETA HCG BLOOD, ED ([facility], [facility], [facility] ONLY)    EKG None  Radiology No results found.  Procedures Procedures    Medications Ordered in ED Medications  sodium chloride 0.9 % bolus 1,000 mL (1,000 mLs Intravenous New Bag/Given 03/17/23 2117)    ED Course/ Medical Decision Making/ A&P                             Medical Decision Making Amount and/or Complexity of Data Reviewed Labs: ordered.  Risk Prescription drug management.    Medical Screen Complete  This patient presented to the ED with complaint of nausea, vomiting, diarrhea.  This complaint involves an extensive number of treatment options. The initial differential diagnosis includes, but is not limited to, gastroenteritis, metabolic abnormality, dehydration,  etc.  This presentation is: Acute, Self-Limited, Previously Undiagnosed, Uncertain Prognosis, and Complicated  Patient is presenting with nausea, vomiting, diarrhea.  Constellation of symptoms is entirely consistent with likely viral gastroenteritis.  Patient screening labs obtained are without significant abnormality.  Patient feels significantly improved after IV fluids.  Repeat abdominal exam is benign.  Patient is taking good p.o. prior to discharge.  Patient is reassured by ED evaluation and workup results.  Importance of close follow-up is stressed.  Strict return precautions given and understood.    Additional history obtained:  External records from outside sources obtained and reviewed including prior ED visits and prior Inpatient records.    Lab Tests:  I ordered and personally interpreted labs.  The pertinent results include: CBC, lipase, CMP, UA, ECG   Cardiac Monitoring:  The patient was maintained on a cardiac monitor.  I personally viewed and interpreted the cardiac monitor which showed an underlying rhythm of: NSR   Medicines ordered:  I ordered medication including IV fluids for mild dehydration Reevaluation of the patient after these medicines showed that the patient: improved   Problem List / ED Course:  Nausea, vomiting, diarrhea   Reevaluation:  After the interventions noted above, I reevaluated the patient and found that they have: improved   Disposition:  After consideration of the diagnostic results and the patients response to treatment, I feel that the patent would benefit from close outpatient follow-up.          Final Clinical Impression(s) / ED Diagnoses Final diagnoses:  Diarrhea, unspecified type  Nausea    Rx / DC Orders ED Discharge Orders          Ordered    ondansetron (ZOFRAN) 4 MG tablet  Every 6 hours        03/17/23 2223              Valarie Merino, MD 03/17/23 2315

## 2023-03-17 NOTE — ED Triage Notes (Signed)
Generalized abdominal, N/V/D since Thursday. Vomiting has subsided, all other sx are still there. Pt states pain is like gas pain. Intermittent headaches
